# Patient Record
Sex: Female | Born: 1995 | Race: White | Hispanic: No | Marital: Single | Smoking: Current every day smoker
Health system: Southern US, Community
[De-identification: ages and names within clinical notes are randomized; demographics above are authoritative.]

## PROBLEM LIST (undated history)

## (undated) DIAGNOSIS — M069 Rheumatoid arthritis, unspecified: Secondary | ICD-10-CM

## (undated) DIAGNOSIS — G629 Polyneuropathy, unspecified: Secondary | ICD-10-CM

## (undated) DIAGNOSIS — T8859XA Other complications of anesthesia, initial encounter: Secondary | ICD-10-CM

## (undated) DIAGNOSIS — M797 Fibromyalgia: Secondary | ICD-10-CM

## (undated) HISTORY — PX: ADENOIDECTOMY: SUR15

## (undated) HISTORY — PX: MYRINGOTOMY: SUR874

## (undated) HISTORY — DX: Other complications of anesthesia, initial encounter: T88.59XA

---
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
## 2017-02-26 ENCOUNTER — Other Ambulatory Visit: Payer: Self-pay

## 2017-02-26 ENCOUNTER — Encounter (HOSPITAL_COMMUNITY): Payer: Self-pay

## 2017-02-26 ENCOUNTER — Emergency Department (HOSPITAL_COMMUNITY): Payer: Self-pay

## 2017-02-26 ENCOUNTER — Emergency Department (HOSPITAL_COMMUNITY)
Admission: EM | Admit: 2017-02-26 | Discharge: 2017-02-26 | Disposition: A | Discharge status: Home / Self Care | Payer: Self-pay | Attending: Emergency Medicine | Admitting: Emergency Medicine

## 2017-02-26 DIAGNOSIS — Y9389 Activity, other specified: Secondary | ICD-10-CM | POA: Insufficient documentation

## 2017-02-26 DIAGNOSIS — M25512 Pain in left shoulder: Secondary | ICD-10-CM | POA: Insufficient documentation

## 2017-02-26 DIAGNOSIS — Y999 Unspecified external cause status: Secondary | ICD-10-CM | POA: Insufficient documentation

## 2017-02-26 DIAGNOSIS — Z79899 Other long term (current) drug therapy: Secondary | ICD-10-CM | POA: Insufficient documentation

## 2017-02-26 DIAGNOSIS — F17210 Nicotine dependence, cigarettes, uncomplicated: Secondary | ICD-10-CM | POA: Insufficient documentation

## 2017-02-26 DIAGNOSIS — J45909 Unspecified asthma, uncomplicated: Secondary | ICD-10-CM | POA: Insufficient documentation

## 2017-02-26 DIAGNOSIS — S161XXA Strain of muscle, fascia and tendon at neck level, initial encounter: Secondary | ICD-10-CM | POA: Insufficient documentation

## 2017-02-26 DIAGNOSIS — Y92410 Unspecified street and highway as the place of occurrence of the external cause: Secondary | ICD-10-CM | POA: Insufficient documentation

## 2017-02-26 DIAGNOSIS — M25511 Pain in right shoulder: Secondary | ICD-10-CM | POA: Insufficient documentation

## 2017-02-26 HISTORY — DX: Rheumatoid arthritis, unspecified: M06.9

## 2017-02-26 HISTORY — DX: Fibromyalgia: M79.7

## 2017-02-26 HISTORY — DX: Polyneuropathy, unspecified: G62.9

## 2017-02-26 MED ORDER — ACETAMINOPHEN 500 MG PO TABS
1000.0000 mg | ORAL_TABLET | Freq: Once | ORAL | Status: AC
  Administered 2017-02-26: 1000 mg via ORAL
  Filled 2017-02-26: qty 2

## 2017-02-26 NOTE — Discharge Instructions (Signed)
Use ice, ibuprofen, Tylenol as needed for pain  Return for new or worsening symptoms.
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
## 2017-02-26 NOTE — ED Notes (Signed)
Patient transported to X-ray 

## 2017-02-26 NOTE — ED Notes (Signed)
ED Provider at bedside. 

## 2017-08-18 ENCOUNTER — Encounter (HOSPITAL_COMMUNITY): Payer: Self-pay | Admitting: Emergency Medicine

## 2017-08-18 ENCOUNTER — Other Ambulatory Visit: Payer: Self-pay

## 2017-08-18 ENCOUNTER — Emergency Department (HOSPITAL_COMMUNITY)
Admission: EM | Admit: 2017-08-18 | Discharge: 2017-08-19 | Disposition: A | Discharge status: Home / Self Care | Payer: Self-pay | Attending: Emergency Medicine | Admitting: Emergency Medicine

## 2017-08-18 DIAGNOSIS — Z79899 Other long term (current) drug therapy: Secondary | ICD-10-CM | POA: Insufficient documentation

## 2017-08-18 DIAGNOSIS — J020 Streptococcal pharyngitis: Secondary | ICD-10-CM | POA: Insufficient documentation

## 2017-08-18 DIAGNOSIS — J45909 Unspecified asthma, uncomplicated: Secondary | ICD-10-CM | POA: Insufficient documentation

## 2017-08-18 DIAGNOSIS — F17210 Nicotine dependence, cigarettes, uncomplicated: Secondary | ICD-10-CM | POA: Insufficient documentation

## 2017-08-18 LAB — GROUP A STREP BY PCR: GROUP A STREP BY PCR: DETECTED — AB

## 2017-08-18 NOTE — ED Triage Notes (Addendum)
Pt reports swelling to throat. Pt reports she is allergic to omnicef. Pt reports her boyfriend has been taking omnicef and she has been kissing him. Pt reports trying to take benadryl yesterday but she vomited. Pt reports vomiting since yesterday, unable to keep anything down.  Pt's throat is red, tongue is coated yellow-white.

## 2017-08-19 MED ORDER — ACETAMINOPHEN 325 MG PO TABS: 650.0000 mg | ORAL_TABLET | Freq: Four times a day (QID) | ORAL | 0 refills | Status: AC | PRN

## 2017-08-19 MED ORDER — AZITHROMYCIN 500 MG PO TABS: 500.0000 mg | ORAL_TABLET | Freq: Every day | ORAL | 0 refills | Status: AC

## 2017-08-19 NOTE — Discharge Instructions (Signed)
You were given a prescription for antibiotics. Please take the antibiotic prescription fully.   You may rotate Tylenol and ibuprofen to treat your fevers.  Make sure you are staying well-hydrated over the next several days.  Please follow up with your primary care provider within 5-7 days for re-evaluation of your symptoms. If you do not have a primary care provider, information for a healthcare clinic has been provided for you to make arrangements for follow up care. Please return to the emergency department for any new or worsening symptoms.

## 2017-08-19 NOTE — ED Provider Notes (Signed)
[facility] EMERGENCY DEPARTMENT Provider Note   CSN: [id number] Arrival date & time: 08/18/17  2134     History   Chief Complaint Chief Complaint  Patient presents with  . Sore Throat    HPI April Hart is a 22 y.o. female.  HPI   Patient is a 22 year old female presents the ED today to be evaluated for sore throat that has been ongoing for the last 3 days.  Patient reports chills and sweats but has had no documented fevers at home.  She is been able to tolerate p.o. at home with no difficulty.  She denies any cough or ear pain.  No chest pain shortness of breath.  She has had 2 episodes of vomiting yesterday, but denies any nausea or belly pain.  Continued vomiting today.  No diarrhea or constipation.  No urinary symptoms.  States that her boyfriend was recently diagnosed with strep throat.  Past Medical History:  Diagnosis Date  . Fibromyalgia   . Neuropathy   . Rheumatoid arthritis [facility])     Patient Active Problem List   Diagnosis Date Noted  . Asthma 01/02/2015  . Allergic rhinitis 01/02/2015    Past Surgical History:  Procedure Laterality Date  . ADENOIDECTOMY    . MYRINGOTOMY       OB History   None      Home Medications    Prior to Admission medications   Medication Sig Start Date End Date Taking? Authorizing Provider  acetaminophen (TYLENOL) 325 MG tablet Take 2 tablets (650 mg total) by mouth every 6 (six) hours as needed. Do not take more than 4000mg  of tylenol per day 08/19/17   Heiress Williamson S, PA-C  albuterol (PROVENTIL HFA;VENTOLIN HFA) 108 (90 BASE) MCG/ACT inhaler Inhale 2 puffs into the lungs every 6 (six) hours as needed for wheezing or shortness of breath.    [provider]  azithromycin (ZITHROMAX) 500 MG tablet Take 1 tablet (500 mg total) by mouth daily for 5 days. 08/19/17 08/24/17  Merrie Epler S, PA-C  loratadine (CLARITIN) 10 MG tablet Take 10 mg by mouth daily as needed for allergies.    [provider]  mometasone (NASONEX) 50 MCG/ACT nasal spray Place 2 sprays into the nose 3 (three) times a week.    [provider]  montelukast (SINGULAIR) 10 MG tablet Take 10 mg by mouth daily.    [provider]    Family History History reviewed. No pertinent family history.  Social History Social History   Tobacco Use  . Smoking status: Current Every Day Smoker    Packs/day: 1.00    Types: Cigarettes  Substance Use Topics  . Alcohol use: No    Frequency: Never  . Drug use: No     Allergies   Amoxicillin; Omnicef [cefdinir]; and Penicillins   Review of Systems Review of Systems  Constitutional: Positive for chills and diaphoresis.  HENT: Positive for sore throat. Negative for congestion and rhinorrhea.   Eyes: Negative for visual disturbance.  Respiratory: Negative for cough and shortness of breath.   Cardiovascular: Negative for chest pain.  Gastrointestinal: Positive for vomiting. Negative for abdominal pain, constipation, diarrhea and nausea.  Genitourinary: Negative for pelvic pain.  Musculoskeletal: Negative for back pain.  Skin: Negative for rash.  Neurological: Negative for headaches.     Physical Exam Updated Vital Signs BP 113/82   Pulse 84   Temp 98.5 F (36.9 C) (Oral)   Resp 20   Ht  5\' 2"  (1.575 m)   Wt 49.9 kg (110 lb)   LMP 08/03/2017   SpO2 99%   BMI 20.12 kg/m   Physical Exam  Constitutional: She appears well-developed and well-nourished. No distress.  HENT:  Head: Normocephalic and atraumatic.  Right Ear: Hearing, tympanic membrane and ear canal normal.  Left Ear: Hearing, tympanic membrane and ear canal normal.  Mouth/Throat: Mucous membranes are normal.  Pharyngeal erythema and exudates are noted.  Patient does not have tonsils.  Uvula is midline.  She is tolerating secretions with a normal voice.  Eyes: Pupils are equal, round, and reactive to light. Conjunctivae and EOM are normal.  Neck: Normal range of  motion. Neck supple.  Cardiovascular: Normal rate, regular rhythm and normal heart sounds.  No murmur heard. Pulmonary/Chest: Effort normal and breath sounds normal. No respiratory distress. She has no wheezes.  Abdominal: Soft. Bowel sounds are normal. There is no tenderness.  Musculoskeletal: She exhibits no edema.  Lymphadenopathy:    She has no cervical adenopathy.  Neurological: She is alert.  Skin: Skin is warm and dry.  Psychiatric: She has a normal mood and affect.  Nursing note and vitals reviewed.    ED Treatments / Results  Labs (all labs ordered are listed, but only abnormal results are displayed) Labs Reviewed  GROUP A STREP BY PCR - Abnormal; Notable for the following components:      Result Value   Group A Strep by PCR DETECTED (*)    All other components within normal limits    EKG None  Radiology No results found.  Procedures Procedures (including critical care time)  Medications Ordered in ED Medications - No data to display   Initial Impression / Assessment and Plan / ED Course  I have reviewed the triage vital signs and the nursing notes.  Pertinent labs & imaging results that were available during my care of the patient were reviewed by me and considered in my medical decision making (see chart for details).   Final Clinical Impressions(s) / ED Diagnoses   Final diagnoses:  Strep throat   Pt febrile with tonsillar exudate, cervical lymphadenopathy, & dysphagia; diagnosis of strep.  Patient is allergic to penicillin.  Will give prescription for azithromycin.  Pt appears mildly dehydrated, discussed importance of water rehydration. Presentation non concerning for PTA or infxn spread to soft tissue. No trismus or uvula deviation. Specific return precautions discussed. Pt able to drink water in ED without difficulty with intact air way. Recommended PCP follow up.   ED Discharge Orders        Ordered    azithromycin (ZITHROMAX) 500 MG tablet  Daily      08/19/17 0047    acetaminophen (TYLENOL) 325 MG tablet  Every 6 hours PRN     08/19/17 0047       10/19/17, PA-C 08/19/17 0053    10/19/17, MD 08/19/17 1454

## 2018-01-23 ENCOUNTER — Encounter (HOSPITAL_COMMUNITY): Payer: Self-pay | Admitting: Emergency Medicine

## 2018-01-23 ENCOUNTER — Ambulatory Visit (HOSPITAL_COMMUNITY)
Admission: EM | Admit: 2018-01-23 | Discharge: 2018-01-23 | Disposition: A | Discharge status: Home / Self Care | Payer: Medicaid Other | Attending: Family Medicine | Admitting: Family Medicine

## 2018-01-23 DIAGNOSIS — Z880 Allergy status to penicillin: Secondary | ICD-10-CM | POA: Insufficient documentation

## 2018-01-23 DIAGNOSIS — F17210 Nicotine dependence, cigarettes, uncomplicated: Secondary | ICD-10-CM | POA: Diagnosis not present

## 2018-01-23 DIAGNOSIS — M069 Rheumatoid arthritis, unspecified: Secondary | ICD-10-CM | POA: Insufficient documentation

## 2018-01-23 DIAGNOSIS — Z881 Allergy status to other antibiotic agents status: Secondary | ICD-10-CM | POA: Insufficient documentation

## 2018-01-23 DIAGNOSIS — M797 Fibromyalgia: Secondary | ICD-10-CM | POA: Diagnosis not present

## 2018-01-23 DIAGNOSIS — J020 Streptococcal pharyngitis: Secondary | ICD-10-CM

## 2018-01-23 DIAGNOSIS — G629 Polyneuropathy, unspecified: Secondary | ICD-10-CM | POA: Insufficient documentation

## 2018-01-23 DIAGNOSIS — Z79899 Other long term (current) drug therapy: Secondary | ICD-10-CM | POA: Diagnosis not present

## 2018-01-23 DIAGNOSIS — J029 Acute pharyngitis, unspecified: Secondary | ICD-10-CM | POA: Diagnosis present

## 2018-01-23 LAB — POCT RAPID STREP A: Streptococcus, Group A Screen (Direct): NEGATIVE

## 2018-01-23 MED ORDER — ALBUTEROL SULFATE HFA 108 (90 BASE) MCG/ACT IN AERS: 2.0000 | INHALATION_SPRAY | Freq: Four times a day (QID) | RESPIRATORY_TRACT | 11 refills | Status: AC | PRN

## 2018-01-23 MED ORDER — CLINDAMYCIN HCL 150 MG PO CAPS: 150.0000 mg | ORAL_CAPSULE | Freq: Three times a day (TID) | ORAL | 0 refills | Status: AC

## 2018-01-23 MED ORDER — MONTELUKAST SODIUM 10 MG PO TABS: 10.0000 mg | ORAL_TABLET | Freq: Every day | ORAL | 3 refills | Status: AC

## 2018-01-23 MED ORDER — CLINDAMYCIN HCL 150 MG PO CAPS: 150.0000 mg | ORAL_CAPSULE | Freq: Three times a day (TID) | ORAL | 0 refills | Status: DC

## 2018-01-23 MED ORDER — CHLORHEXIDINE GLUCONATE 0.12 % MT SOLN: 15.0000 mL | Freq: Two times a day (BID) | OROMUCOSAL | 0 refills | Status: AC

## 2018-01-23 MED ORDER — MOMETASONE FUROATE 50 MCG/ACT NA SUSP: 2.0000 | NASAL | 3 refills | Status: AC

## 2018-01-23 MED ORDER — CHLORHEXIDINE GLUCONATE 0.12 % MT SOLN: 15.0000 mL | Freq: Two times a day (BID) | OROMUCOSAL | 0 refills | Status: DC

## 2018-01-23 MED ORDER — LORATADINE 10 MG PO TABS: 10.0000 mg | ORAL_TABLET | Freq: Every day | ORAL | 1 refills | Status: AC | PRN

## 2018-01-23 NOTE — Discharge Instructions (Addendum)
Use Afrin (oxymetazoline) nasal spray at night to open up your sinuses and allow you to sleep.  Discontinue the Chapstick that you are using.

## 2018-01-23 NOTE — ED Triage Notes (Signed)
Pt states her boyfriend has strep and her throat is also sore.

## 2018-01-23 NOTE — ED Provider Notes (Signed)
[facility]    CSN: [id number] Arrival date & time: 01/23/18  1618     History   Chief Complaint Chief Complaint  Patient presents with  . Sore Throat    HPI April Hart is a 22 y.o. female.   Pt states her boyfriend has strep and her throat is also sore.  She complains about ear pain as well on the left.  She is no longer working.  Patient states that her symptoms began about a week and 1/2 to 2 weeks ago.     Past Medical History:  Diagnosis Date  . Fibromyalgia   . Neuropathy   . Rheumatoid arthritis [facility])     Patient Active Problem List   Diagnosis Date Noted  . Asthma 01/02/2015  . Allergic rhinitis 01/02/2015    Past Surgical History:  Procedure Laterality Date  . ADENOIDECTOMY    . MYRINGOTOMY      OB History   None      Home Medications    Prior to Admission medications   Medication Sig Start Date End Date Taking? Authorizing Provider  acetaminophen (TYLENOL) 325 MG tablet Take 2 tablets (650 mg total) by mouth every 6 (six) hours as needed. Do not take more than 4000mg  of tylenol per day 08/19/17   Couture, Cortni S, PA-C  albuterol (PROVENTIL HFA;VENTOLIN HFA) 108 (90 BASE) MCG/ACT inhaler Inhale 2 puffs into the lungs every 6 (six) hours as needed for wheezing or shortness of breath.    [provider]  chlorhexidine (PERIDEX) 0.12 % solution Use as directed 15 mLs in the mouth or throat 2 (two) times daily. 01/23/18   01/25/18, MD  clindamycin (CLEOCIN) 150 MG capsule Take 1 capsule (150 mg total) by mouth 3 (three) times daily. 01/23/18   01/25/18, MD  loratadine (CLARITIN) 10 MG tablet Take 10 mg by mouth daily as needed for allergies.    [provider]  mometasone (NASONEX) 50 MCG/ACT nasal spray Place 2 sprays into the nose 3 (three) times a week.    [provider]  montelukast (SINGULAIR) 10 MG tablet Take 10 mg by mouth daily.    [provider]    Family History No  family history on file.  Social History Social History   Tobacco Use  . Smoking status: Current Every Day Smoker    Packs/day: 1.00    Types: Cigarettes  Substance Use Topics  . Alcohol use: No    Frequency: Never  . Drug use: No     Allergies   Amoxicillin; Omnicef [cefdinir]; and Penicillins   Review of Systems Review of Systems  Constitutional: Positive for chills and fatigue.  HENT: Positive for congestion, mouth sores and sore throat.   Respiratory: Negative.   Cardiovascular: Negative.   Gastrointestinal: Negative.   Musculoskeletal: Positive for myalgias.  Neurological: Positive for headaches.  All other systems reviewed and are negative.    Physical Exam Triage Vital Signs ED Triage Vitals [01/23/18 1647]  Enc Vitals Group     BP 114/82     Pulse Rate 70     Resp 18     Temp 98.4 F (36.9 C)     Temp src      SpO2 98 %     Weight      Height      Head Circumference      Peak Flow      Pain Score 8     Pain Loc  Pain Edu?      Excl. in GC?    No data found.  Updated Vital Signs BP 114/82   Pulse 70   Temp 98.4 F (36.9 C)   Resp 18   SpO2 98%    Physical Exam  Constitutional: She appears well-developed and well-nourished.  HENT:  Head: Atraumatic.  Right Ear: Hearing, tympanic membrane and ear canal normal.  Left Ear: Hearing, tympanic membrane and ear canal normal.  Mouth/Throat: Posterior oropharyngeal erythema present. No tonsillar exudate.  Lower lip is mildly swollen and edematous  Eyes: Pupils are equal, round, and reactive to light. EOM are normal.  Neck: Normal range of motion. Neck supple.  Cardiovascular: Normal rate.  Pulmonary/Chest: Effort normal.  Neurological: She is alert.  Skin: Skin is warm and dry.  Psychiatric: She has a normal mood and affect.  Nursing note and vitals reviewed.    UC Treatments / Results  Labs (all labs ordered are listed, but only abnormal results are displayed) Labs Reviewed    CULTURE, GROUP A STREP [facility])    EKG None  Radiology No results found.  Procedures Procedures (including critical care time)  Medications Ordered in UC Medications - No data to display  Initial Impression / Assessment and Plan / UC Course  I have reviewed the triage vital signs and the nursing notes.  Pertinent labs & imaging results that were available during my care of the patient were reviewed by me and considered in my medical decision making (see chart for details).     Final Clinical Impressions(s) / UC Diagnoses   Final diagnoses:  Strep pharyngitis     Discharge Instructions     Use Afrin (oxymetazoline) nasal spray at night to open up your sinuses and allow you to sleep.  Discontinue the Chapstick that you are using.    ED Prescriptions    Medication Sig Dispense Auth. Provider   clindamycin (CLEOCIN) 150 MG capsule Take 1 capsule (150 mg total) by mouth 3 (three) times daily. 28 capsule Elvina Sidle, MD   chlorhexidine (PERIDEX) 0.12 % solution Use as directed 15 mLs in the mouth or throat 2 (two) times daily. 120 mL Elvina Sidle, MD     Controlled Substance Prescriptions [place] Controlled Substance Registry consulted? Not Applicable   Elvina Sidle, MD 01/23/18 1700

## 2018-01-25 LAB — CULTURE, GROUP A STREP (THRC)

## 2018-01-26 ENCOUNTER — Telehealth (HOSPITAL_COMMUNITY): Payer: Self-pay

## 2018-01-26 NOTE — Telephone Encounter (Signed)
Culture is positive for group A Strep germ.  Prescription for penicillin V 500mg  bid x 10d #20 given to patient at visit. Attempted to contact patient. No answer at this time.

## 2020-07-14 ENCOUNTER — Inpatient Hospital Stay (HOSPITAL_COMMUNITY): Payer: Medicaid Other

## 2020-07-14 ENCOUNTER — Inpatient Hospital Stay (HOSPITAL_COMMUNITY)
Admission: AD | Admit: 2020-07-14 | Discharge: 2020-07-14 | Disposition: A | Discharge status: Home / Self Care | Payer: Medicaid Other | Attending: Obstetrics and Gynecology | Admitting: Obstetrics and Gynecology

## 2020-07-14 ENCOUNTER — Other Ambulatory Visit: Payer: Self-pay

## 2020-07-14 ENCOUNTER — Encounter (HOSPITAL_COMMUNITY): Payer: Self-pay | Admitting: Obstetrics and Gynecology

## 2020-07-14 DIAGNOSIS — O99331 Smoking (tobacco) complicating pregnancy, first trimester: Secondary | ICD-10-CM | POA: Diagnosis not present

## 2020-07-14 DIAGNOSIS — T7491XA Unspecified adult maltreatment, confirmed, initial encounter: Secondary | ICD-10-CM

## 2020-07-14 DIAGNOSIS — Z3A08 8 weeks gestation of pregnancy: Secondary | ICD-10-CM | POA: Diagnosis not present

## 2020-07-14 DIAGNOSIS — F17210 Nicotine dependence, cigarettes, uncomplicated: Secondary | ICD-10-CM | POA: Diagnosis not present

## 2020-07-14 DIAGNOSIS — O9A311 Physical abuse complicating pregnancy, first trimester: Secondary | ICD-10-CM | POA: Insufficient documentation

## 2020-07-14 DIAGNOSIS — O26891 Other specified pregnancy related conditions, first trimester: Secondary | ICD-10-CM | POA: Insufficient documentation

## 2020-07-14 DIAGNOSIS — R109 Unspecified abdominal pain: Secondary | ICD-10-CM | POA: Insufficient documentation

## 2020-07-14 DIAGNOSIS — T7411XA Adult physical abuse, confirmed, initial encounter: Secondary | ICD-10-CM

## 2020-07-14 DIAGNOSIS — O30041 Twin pregnancy, dichorionic/diamniotic, first trimester: Secondary | ICD-10-CM | POA: Insufficient documentation

## 2020-07-14 LAB — URINALYSIS, ROUTINE W REFLEX MICROSCOPIC
Bilirubin Urine: NEGATIVE
Glucose, UA: NEGATIVE mg/dL
Hgb urine dipstick: NEGATIVE
Ketones, ur: NEGATIVE mg/dL
Leukocytes,Ua: NEGATIVE
Nitrite: NEGATIVE
Protein, ur: NEGATIVE mg/dL
Specific Gravity, Urine: 1.015 (ref 1.005–1.030)
pH: 5 (ref 5.0–8.0)

## 2020-07-14 LAB — POCT PREGNANCY, URINE: Preg Test, Ur: POSITIVE — AB
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
## 2020-07-14 NOTE — Discharge Instructions (Signed)
JAMA, [id number]. [email or web address]  Domestic Violence and Pregnancy Intimate partner violence is any type of physical, sexual, or emotional harm done by a current or former partner. Emotional abuse includes threatening and controlling behavior. Intimate partner violence is also called domestic violence. Intimate partner violence is especially dangerous during pregnancy because the abuse may affect you and your developing baby. Call a domestic violence hotline or let your health care provider know if you are being physically or sexually abused, or if your partner's threatening or controlling behavior is making you feel unsafe. Getting help and support protects you, your pregnancy, and your baby. How does this affect me? Intimate partner violence can cause:  Physical effects, such as: ? Injury or death (homicide). ? Digestion problems. ? Loss of appetite. ? Frequent infections, including sexually transmitted infections. ? High blood pressure.  Emotional effects, such as: ? Fear and worry. ? Trouble sleeping. ? Depression. ? Anxiety. ? Thoughts of hurting yourself or committing suicide. Intimate partner violence may affect your pregnancy in these ways:  You are more likely to be injured or have poor health.  You may be less likely to get prenatal care.  You may not gain a healthy amount of weight or have good nutrition.  You may be more likely to smoke, use drugs, and drink alcohol to relieve stress.  You may be at higher risk of losing your pregnancy (miscarriage or stillbirth).  Your baby may be born before 37 weeks of pregnancy (premature). How does this affect my baby? If you experience intimate partner violence during pregnancy:  Your baby may be injured.  Your baby may not survive the pregnancy (miscarriage or stillbirth).  Your baby may be born premature, which can cause mental and physical problems.  Your baby may not grow well in your  womb and may be born small (small for gestational age).  If you use alcohol, your baby may be born with fetal alcohol syndrome. This can cause birth defects and other problems.  You may have trouble bonding with your baby. This can lead to neglect.  You may be less likely to breastfeed. This is the healthiest way to feed your baby. Follow these instructions at home:  Let your health care provider know that you are experiencing intimate partner violence.  Do not bring an abusive partner with you to your prenatal visits. This will allow you to speak freely with your health care provider.  Learn about and use resources on intimate partner violence, such as the [employer] Violence Hotline: thehotline.org  Consider getting counseling.  Consider getting a legal document that says your partner has to stay away from you (restraining order).  Have a support person stay with you in your home. Have an escape plan to get to a safe place.  Do not smoke,use drugs, or drink alcohol to relieve stress.  Keep all your prenatal visits as told. This is important.   Where to find more information  The [employer] Violence Hotline: ? 24-hour hotline: 403 562 4143 or 385-480-7576 Gwenevere Abbot) ? Website: thehotline.org  The National Sexual Assault Hotline: ? 24-hour hotline: 440-023-8007 ? Website: rainn.org  Love is respect: ? Text "loveis" to (708) 420-3462 ? Call: 236 365 3735 or (279) 233-2889 Gwenevere Abbot) ? Website: loveisrespect.org If you do not feel safe searching for help online at home, use a computer at a [employer] to access the internet. Call 911 if you are in immediate danger or need medical help. Contact a health care provider if:  You experience any type  of intimate partner violence at home.  You need help to quit smoking, drinking, or taking drugs. Get help right away if:  You do not feel safe at home.  You have thoughts of hurting yourself or committing suicide. If you ever feel  like you may hurt yourself or others, or have thoughts about taking your own life, get help right away. Go to your nearest emergency department or:  Call your local emergency services (911 in the [place]  Call a suicide crisis helpline, such as the National Suicide Prevention Lifeline at 760-819-3953. This is open 24 hours a day in the [place]  Text the Crisis Text Line at 639-762-1094 (in the [place] Summary  Intimate partner violence is also called domestic violence. This kind of violence can be physical, sexual, or emotional.  Intimate partner violence is especially dangerous during pregnancy. It can increase your baby's risk of miscarriage, stillbirth, and premature birth.  Tell your health care provider if you experience any type of intimate partner violence. Get help right away if you do not feel safe at home. This information is not intended to replace advice given to you by your health care provider. Make sure you discuss any questions you have with your health care provider. Document Revised: 08/04/2019 Document Reviewed: 08/04/2019 Elsevier Patient Education  2021 [employer].      [facility] ([place]).Twin and Triplet Pregnancy. [place]: [employer] and [employer] ([place]); 2019.">  Multiple Pregnancy Multiple pregnancy means that a woman is carrying more than one baby at a time. She may be pregnant with twins, triplets, or more. The majority of multiple pregnancies are twins. Naturally conceiving triplets or more (higher-order multiples) is rare. Multiple pregnancies are riskier than single pregnancies. A woman with a multiple pregnancy is more likely to have certain problems during her pregnancy. How does a multiple pregnancy happen? A multiple pregnancy happens when:  The woman's body releases more than one egg at a time, and then each egg gets fertilized by a different sperm. ? This is the most common type of multiple pregnancy. ? Twins or other  multiples produced this way are called fraternal. They are no more alike than non-multiple siblings are.  One sperm fertilizes one egg, which then divides into more than one embryo. ? Twins or other multiples produced this way are called identical. Identical multiples are always the same gender, and they look very much alike.   Who is most likely to have a multiple pregnancy? A multiple pregnancy is more likely to develop in women who:  Have had fertility treatment, especially if the treatment included fertility medicines.  Are older than 23 years of age.  Have already had four or more children.  Have a family history of multiple pregnancy. How is a multiple pregnancy diagnosed? A multiple pregnancy may be diagnosed based on:  Symptoms such as: ? Rapid weight gain in the first 3 months of pregnancy (first trimester). ? More severe nausea and breast tenderness than what is typical of a single pregnancy. ? A larger uterus than what is normal for the stage of the pregnancy.  Blood tests that detect a higher-than-normal level of human chorionic gonadotropin (hCG). This is a hormone that your body produces in early pregnancy.  An ultrasound exam. This is used to confirm that you are carrying multiples. What risks come with multiple pregnancy? A multiple pregnancy puts you at a higher risk for certain problems during or after your pregnancy. These include:  Delivering  your babies before your due date (preterm birth). A full-term pregnancy lasts for at least 37 weeks. ? Babies born before 37 weeks may have a higher risk for breathing problems, feeding difficulties, cerebral palsy, and learning disabilities.  Diabetes.  Preeclampsia. This is a serious condition that causes high blood pressure and headaches during pregnancy.  Too much blood loss after childbirth (postpartum hemorrhage).  Postpartum depression.  Low birth weight of the babies. How will having a multiple pregnancy affect  my care? Your health care team will monitor you more closely. You may need more frequent prenatal visits. This will ensure that you are healthy and that your babies are growing normally. Follow these instructions at home: Eating and drinking  Increase your nutrition. ? Follow your health care provider's recommendations for weight gain. You may need to gain a little extra weight when you are pregnant with multiples. ? Eat healthy snacks often throughout the day. This will add calories and reduce nausea.  Drink enough fluid to keep your urine pale yellow.  Take prenatal vitamins. Ask your health care provider what vitamins are right for you. Activity Limit your activities by 20-24 weeks of pregnancy.  Rest often.  Avoid activities, exercise, and work that take a lot of effort.  Ask your health care provider when you should stop having sex. General instructions  Do not use any products that contain nicotine or tobacco, such as cigarettes, e-cigarettes, and chewing tobacco. If you need help quitting, ask your health care provider.  Do not drink alcohol or use illegal drugs.  Take over-the-counter and prescription medicines only as told by your health care provider.  Arrange for extra help around the house.  Keep all follow-up visits and all prenatal visits as told by your health care provider. This is important. Where to find more information  [employer]: www.acog.org Contact a health care provider if:  You have dizziness.  You have nausea, vomiting, or diarrhea that does not go away.  You have depression or other emotions that are interfering with your normal activities.  You have a fever.  You have pain with urination.  You have a bad-smelling vaginal discharge.  You notice increased swelling in your face, hands, legs, or ankles. Get help right away if:  You have fluid leaking from your vagina.  You have bleeding from your  vagina.  You have pelvic cramps, pelvic pressure, or nagging pain in your abdomen or lower back.  You are having regular contractions.  You have a severe headache, with or without changes in how you see.  You have chest pain or shortness of breath.  You notice that your babies move less often, or do not move at all. Summary  Having a multiple pregnancy means that a woman is carrying more than one baby at a time.  A multiple pregnancy puts you at a higher risk for delivering your babies before your due date, having diabetes, preeclampsia, too much blood loss after childbirth, or low birth weight of the babies.  Your health care provider will monitor you more closely during your pregnancy.  You may need to make some lifestyle changes during pregnancy. This includes eating more, limiting your activities after 20-24 weeks of pregnancy, and arranging for extra help around the house.  Follow up with your health care provider as instructed if you experience any complications. This information is not intended to replace advice given to you by your health care provider. Make sure you discuss  any questions you have with your health care provider. Document Revised: 11/21/2018 Document Reviewed: 11/21/2018 Elsevier Patient Education  2021 [employer].
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
## 2020-09-13 ENCOUNTER — Other Ambulatory Visit: Payer: Self-pay | Admitting: Obstetrics & Gynecology

## 2020-09-13 DIAGNOSIS — O30042 Twin pregnancy, dichorionic/diamniotic, second trimester: Secondary | ICD-10-CM

## 2020-09-13 DIAGNOSIS — Z3A17 17 weeks gestation of pregnancy: Secondary | ICD-10-CM

## 2020-09-24 ENCOUNTER — Other Ambulatory Visit: Payer: Self-pay

## 2020-10-08 ENCOUNTER — Ambulatory Visit: Payer: Medicaid Other | Attending: Obstetrics and Gynecology

## 2020-10-08 ENCOUNTER — Ambulatory Visit: Payer: Medicaid Other | Admitting: *Deleted

## 2020-10-08 ENCOUNTER — Other Ambulatory Visit: Payer: Self-pay | Admitting: *Deleted

## 2020-10-08 ENCOUNTER — Other Ambulatory Visit: Payer: Self-pay

## 2020-10-08 ENCOUNTER — Encounter: Payer: Self-pay | Admitting: *Deleted

## 2020-10-08 ENCOUNTER — Ambulatory Visit: Payer: Medicaid Other | Attending: Obstetrics | Admitting: Obstetrics

## 2020-10-08 VITALS — BP 104/67 | HR 71

## 2020-10-08 DIAGNOSIS — Z3A20 20 weeks gestation of pregnancy: Secondary | ICD-10-CM

## 2020-10-08 DIAGNOSIS — O30042 Twin pregnancy, dichorionic/diamniotic, second trimester: Secondary | ICD-10-CM | POA: Diagnosis not present

## 2020-10-08 DIAGNOSIS — Z3A17 17 weeks gestation of pregnancy: Secondary | ICD-10-CM | POA: Diagnosis present
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
## 2020-11-05 ENCOUNTER — Other Ambulatory Visit: Payer: Self-pay

## 2020-11-05 ENCOUNTER — Encounter: Payer: Self-pay | Admitting: *Deleted

## 2020-11-05 ENCOUNTER — Ambulatory Visit: Payer: Medicaid Other | Attending: Obstetrics

## 2020-11-05 ENCOUNTER — Ambulatory Visit: Payer: Medicaid Other | Admitting: *Deleted

## 2020-11-05 ENCOUNTER — Other Ambulatory Visit: Payer: Self-pay | Admitting: *Deleted

## 2020-11-05 VITALS — BP 109/63 | HR 74

## 2020-11-05 DIAGNOSIS — Z3A24 24 weeks gestation of pregnancy: Secondary | ICD-10-CM

## 2020-11-05 DIAGNOSIS — O99332 Smoking (tobacco) complicating pregnancy, second trimester: Secondary | ICD-10-CM | POA: Diagnosis not present

## 2020-11-05 DIAGNOSIS — Z363 Encounter for antenatal screening for malformations: Secondary | ICD-10-CM

## 2020-11-05 DIAGNOSIS — O30042 Twin pregnancy, dichorionic/diamniotic, second trimester: Secondary | ICD-10-CM

## 2020-11-05 DIAGNOSIS — F17210 Nicotine dependence, cigarettes, uncomplicated: Secondary | ICD-10-CM

## 2020-11-05 DIAGNOSIS — O321XX1 Maternal care for breech presentation, fetus 1: Secondary | ICD-10-CM

## 2020-11-05 DIAGNOSIS — O30002 Twin pregnancy, unspecified number of placenta and unspecified number of amniotic sacs, second trimester: Secondary | ICD-10-CM

## 2020-11-05 DIAGNOSIS — O322XX2 Maternal care for transverse and oblique lie, fetus 2: Secondary | ICD-10-CM

## 2020-12-03 ENCOUNTER — Ambulatory Visit: Payer: Medicaid Other | Admitting: *Deleted

## 2020-12-03 ENCOUNTER — Encounter: Payer: Self-pay | Admitting: *Deleted

## 2020-12-03 ENCOUNTER — Other Ambulatory Visit: Payer: Self-pay | Admitting: Maternal & Fetal Medicine

## 2020-12-03 ENCOUNTER — Ambulatory Visit: Payer: Medicaid Other | Attending: Obstetrics and Gynecology

## 2020-12-03 ENCOUNTER — Other Ambulatory Visit: Payer: Self-pay

## 2020-12-03 VITALS — BP 102/71 | HR 81

## 2020-12-03 DIAGNOSIS — O99333 Smoking (tobacco) complicating pregnancy, third trimester: Secondary | ICD-10-CM

## 2020-12-03 DIAGNOSIS — O30043 Twin pregnancy, dichorionic/diamniotic, third trimester: Secondary | ICD-10-CM | POA: Insufficient documentation

## 2020-12-03 DIAGNOSIS — Z3A28 28 weeks gestation of pregnancy: Secondary | ICD-10-CM

## 2020-12-03 DIAGNOSIS — F17210 Nicotine dependence, cigarettes, uncomplicated: Secondary | ICD-10-CM

## 2020-12-03 DIAGNOSIS — O321XX1 Maternal care for breech presentation, fetus 1: Secondary | ICD-10-CM | POA: Diagnosis not present

## 2020-12-03 DIAGNOSIS — O30002 Twin pregnancy, unspecified number of placenta and unspecified number of amniotic sacs, second trimester: Secondary | ICD-10-CM | POA: Diagnosis not present

## 2020-12-03 DIAGNOSIS — O321XX2 Maternal care for breech presentation, fetus 2: Secondary | ICD-10-CM

## 2020-12-25 ENCOUNTER — Ambulatory Visit: Payer: Medicaid Other

## 2020-12-30 ENCOUNTER — Ambulatory Visit: Payer: Medicaid Other

## 2020-12-31 ENCOUNTER — Ambulatory Visit: Payer: Medicaid Other | Admitting: *Deleted

## 2020-12-31 ENCOUNTER — Other Ambulatory Visit: Payer: Self-pay

## 2020-12-31 ENCOUNTER — Encounter: Payer: Self-pay | Admitting: *Deleted

## 2020-12-31 ENCOUNTER — Other Ambulatory Visit: Payer: Self-pay | Admitting: *Deleted

## 2020-12-31 ENCOUNTER — Ambulatory Visit: Payer: Medicaid Other | Attending: Maternal & Fetal Medicine

## 2020-12-31 VITALS — BP 117/82 | HR 99

## 2020-12-31 DIAGNOSIS — O99333 Smoking (tobacco) complicating pregnancy, third trimester: Secondary | ICD-10-CM | POA: Insufficient documentation

## 2020-12-31 DIAGNOSIS — O30043 Twin pregnancy, dichorionic/diamniotic, third trimester: Secondary | ICD-10-CM

## 2020-12-31 DIAGNOSIS — Z3A32 32 weeks gestation of pregnancy: Secondary | ICD-10-CM | POA: Diagnosis not present

## 2021-01-08 ENCOUNTER — Other Ambulatory Visit: Payer: Self-pay

## 2021-01-08 ENCOUNTER — Encounter: Payer: Self-pay | Admitting: Registered"

## 2021-01-08 ENCOUNTER — Encounter: Payer: Medicaid Other | Attending: Obstetrics and Gynecology | Admitting: Registered"

## 2021-01-08 DIAGNOSIS — Z3A00 Weeks of gestation of pregnancy not specified: Secondary | ICD-10-CM | POA: Diagnosis not present

## 2021-01-08 DIAGNOSIS — O24419 Gestational diabetes mellitus in pregnancy, unspecified control: Secondary | ICD-10-CM | POA: Insufficient documentation

## 2021-01-08 DIAGNOSIS — R7309 Other abnormal glucose: Secondary | ICD-10-CM | POA: Diagnosis not present

## 2021-01-08 NOTE — Progress Notes (Signed)
Patient was seen on 01/08/21 for Gestational Diabetes self-management class at the [facility]. The following learning objectives were met by the patient during this course:  States the definition of Gestational Diabetes States why dietary management is important in controlling blood glucose Describes the effects each nutrient has on blood glucose levels Demonstrates ability to create a balanced meal plan Demonstrates carbohydrate counting  States when to check blood glucose levels Demonstrates proper blood glucose monitoring techniques States the effect of stress and exercise on blood glucose levels States the importance of limiting caffeine and abstaining from alcohol and smoking  Blood glucose monitor given: [employer] Reflect Lot# [id number] X Exp: 02/10/2021 Blood Glucose: 99 mg/mL  Patient instructed to monitor glucose levels: FBS: 60 - <95; 1 hour: <140; 2 hour: <120  Patient received handouts: Nutrition Diabetes and Pregnancy, including carb counting list  Patient will be seen for follow-up as needed.

## 2021-01-14 ENCOUNTER — Ambulatory Visit: Payer: Medicaid Other

## 2021-01-15 ENCOUNTER — Ambulatory Visit: Payer: Medicaid Other

## 2021-01-15 ENCOUNTER — Other Ambulatory Visit: Payer: Medicaid Other

## 2021-01-21 ENCOUNTER — Ambulatory Visit: Payer: Medicaid Other

## 2021-01-28 ENCOUNTER — Ambulatory Visit: Payer: Medicaid Other

## 2021-02-11 ENCOUNTER — Encounter (HOSPITAL_COMMUNITY): Payer: Self-pay

## 2021-02-11 ENCOUNTER — Inpatient Hospital Stay (HOSPITAL_COMMUNITY): Admit: 2021-02-11 | Payer: Medicaid Other | Admitting: Obstetrics and Gynecology

## 2021-02-11 SURGERY — Surgical Case
Anesthesia: Regional

## 2021-05-22 IMAGING — US US OB COMP LESS 14 WK
1 series · 15 of 28 positions shown · non-contrast
Comparison: None.

CLINICAL DATA: Abdominal pain. Estimated gestational age by last
menstrual period equals 8 weeks 3 days.

EXAM:
TWIN OBSTETRIC <14WK US AND TRANSVAGINAL OB US

[Series 1: us ob comp less 14 wk · 51 acquisitions, 15 frames shown]
[im 1/51]
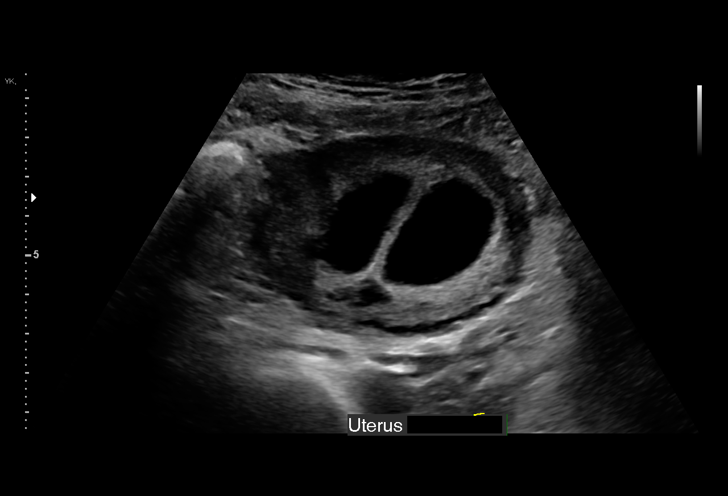
[im 4/51]
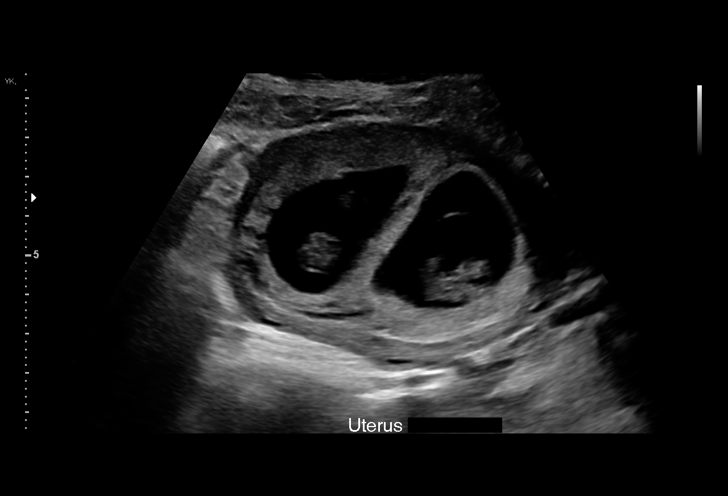
[im 8/51]
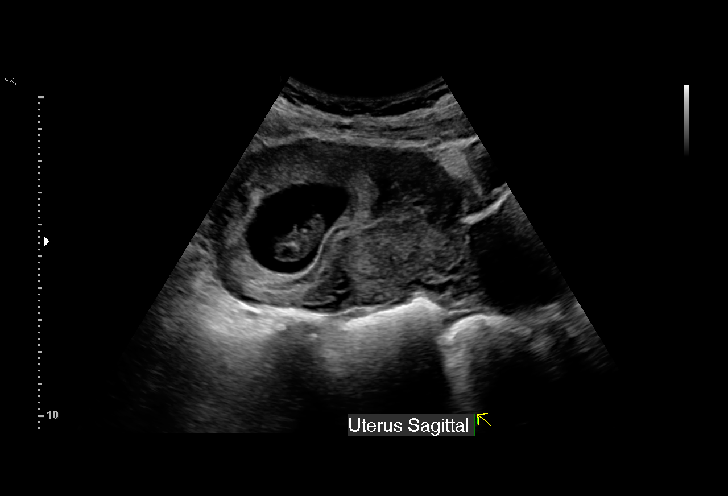
[im 12/51]
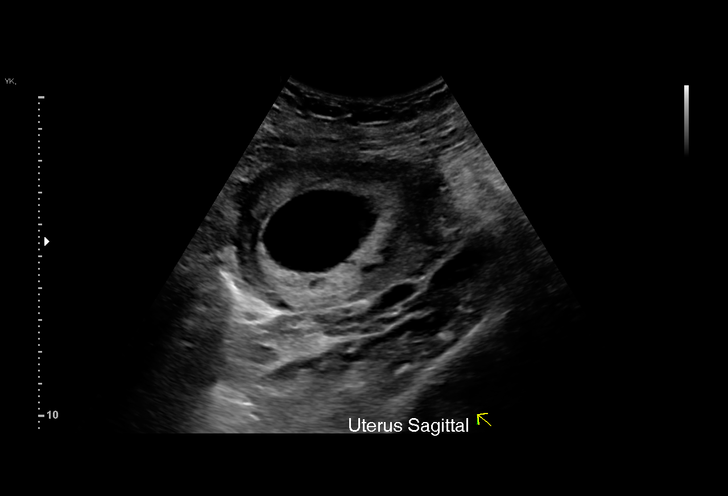
[im 15/51]
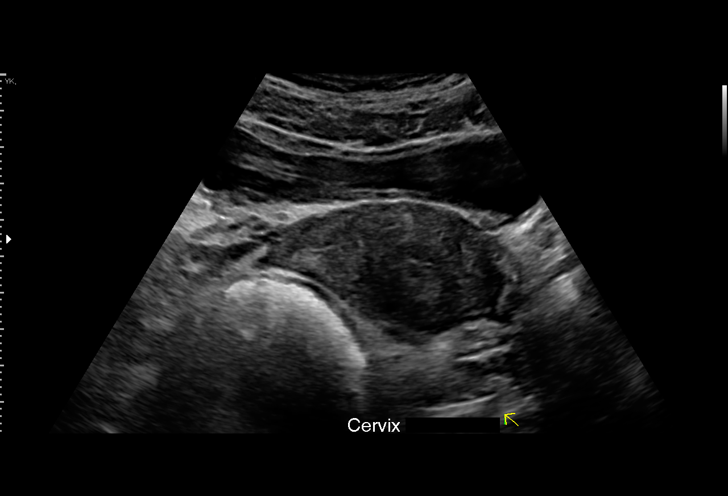
[im 19/51]
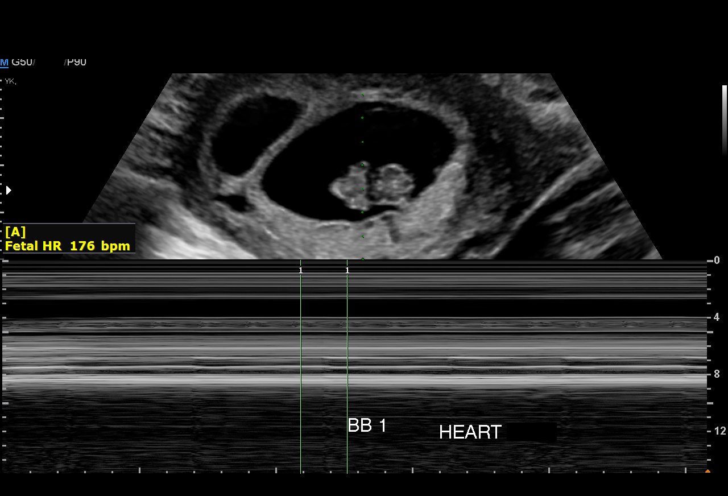
[im 23/51]
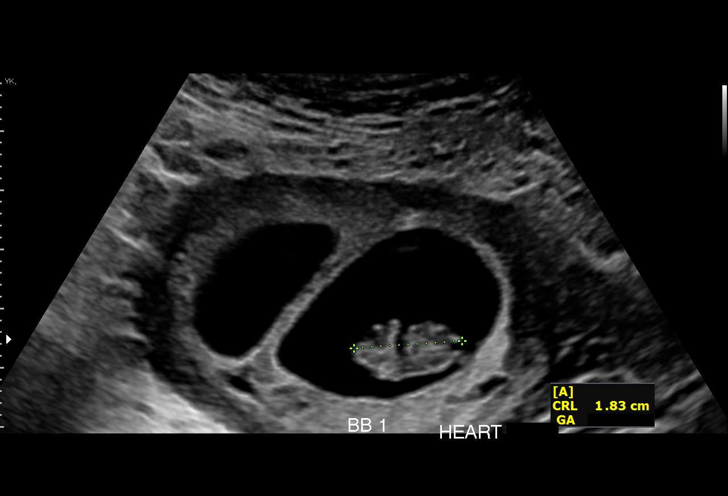
[im 26/51]
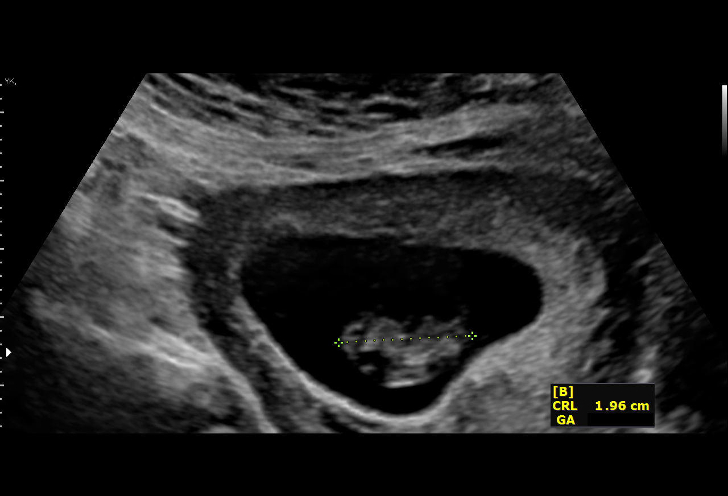
[im 28/51]
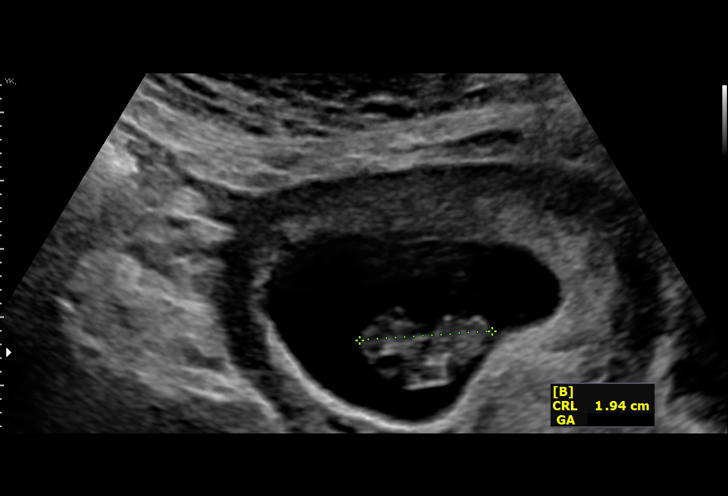
[im 32/51]
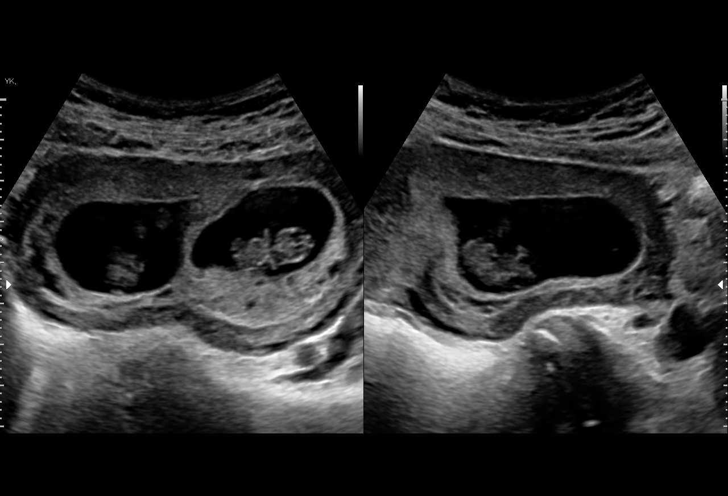
[im 36/51]
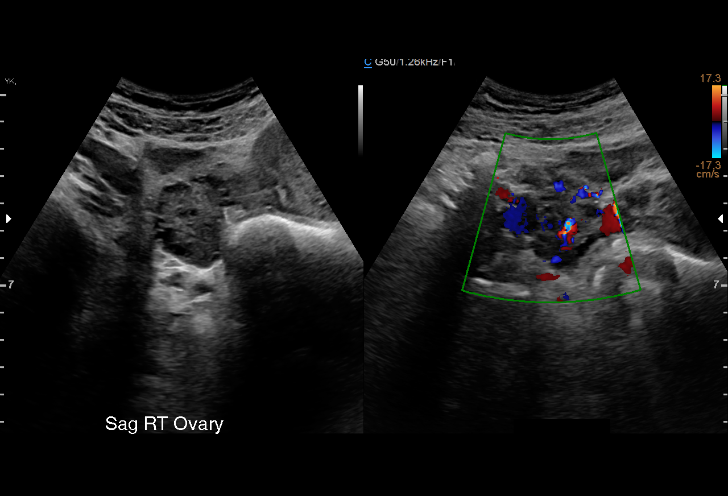
[im 39/51]
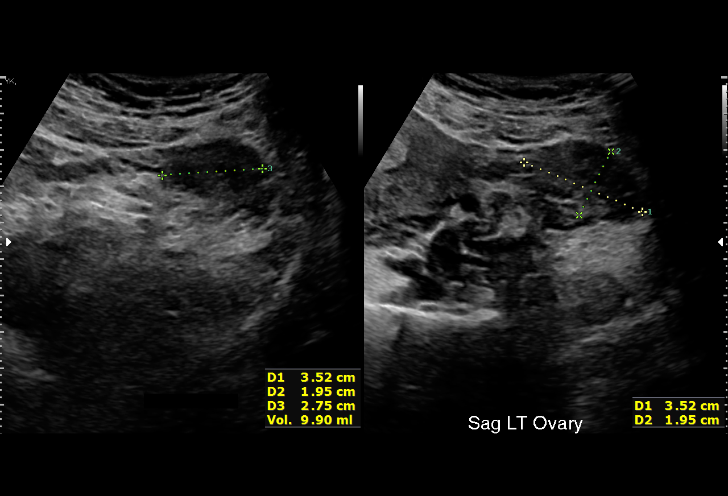
[im 43/51]
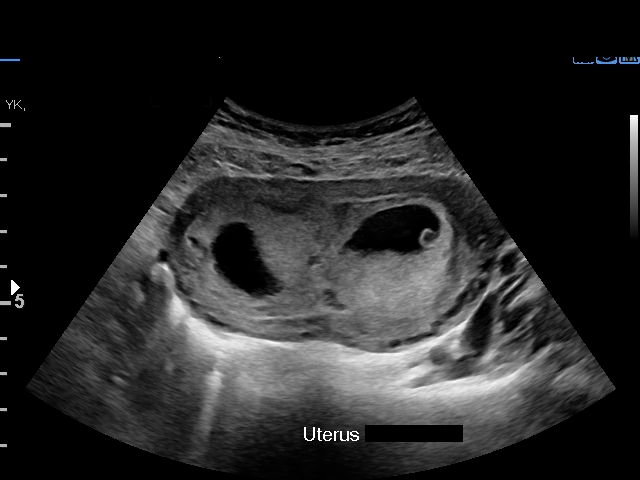
[im 47/51]
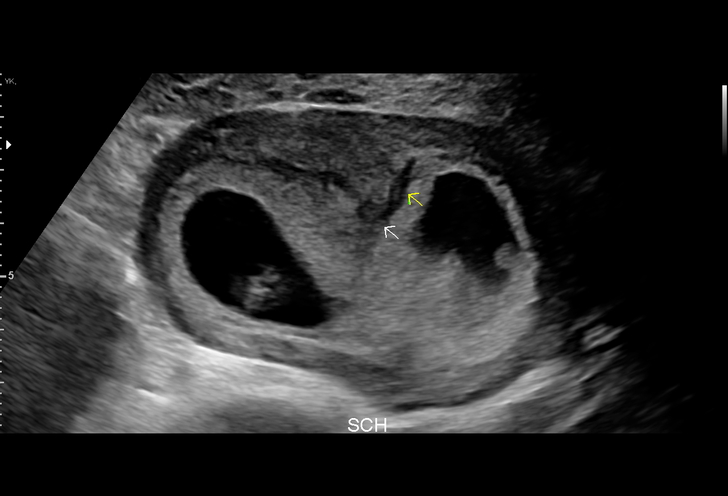
[im 51/51]
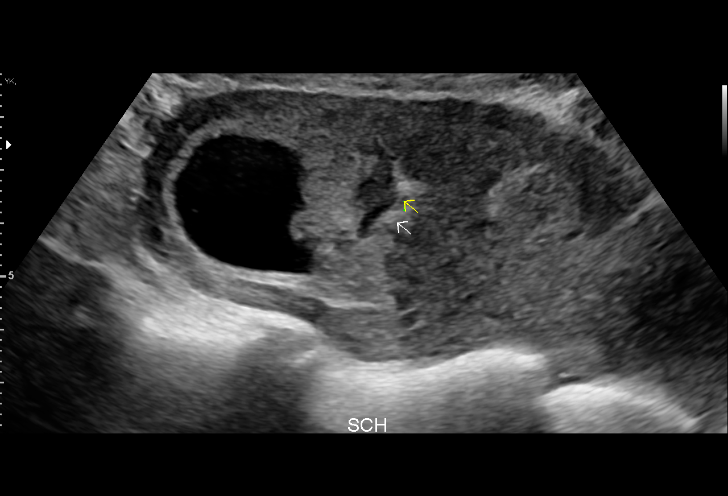

[15 of 28 positions shown; findings below may reference images not displayed]

FINDINGS: Number of IUPs:  2

Chorionicity/Amnionicity:  Dichorionic / diamniotic

TWIN 1

Yolk sac:  Present

Embryo:  Present

Cardiac Activity: Present

Heart Rate: 170 bpm

CRL:  19 mm   8 w 2 d                  US EDC: 02/21/2021

TWIN 2

Yolk sac:  Present

Embryo:  Present

Cardiac Activity: Present

Heart Rate: 170 bpm

CRL:  19.3 mm   8 w 3 d                  US EDC: 11/ [DATE]

Subchorionic hemorrhage:  Small

Maternal uterus/adnexae: Normal ovaries.  No free fluid
IMPRESSION: 1. Twin intrauterine gestations with embryos and normal cardiac
activity.

2. Estimated gestational age by crown rump length equals 8 weeks 3
days.

## 2021-11-08 IMAGING — US US MFM OB FOLLOW-UP
1 series · 12 of 28 positions shown · non-contrast
Comparison: none

[Series 1: us mfm ob follow-up · 12 of 66 slices shown]
[im 3/66]
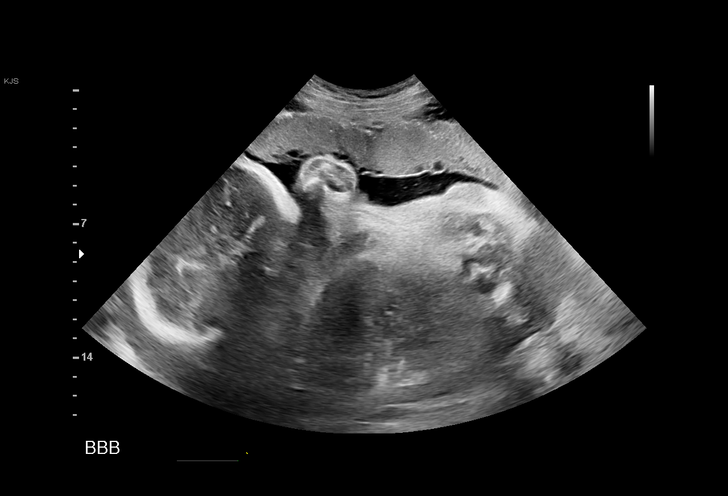
[im 8/66]
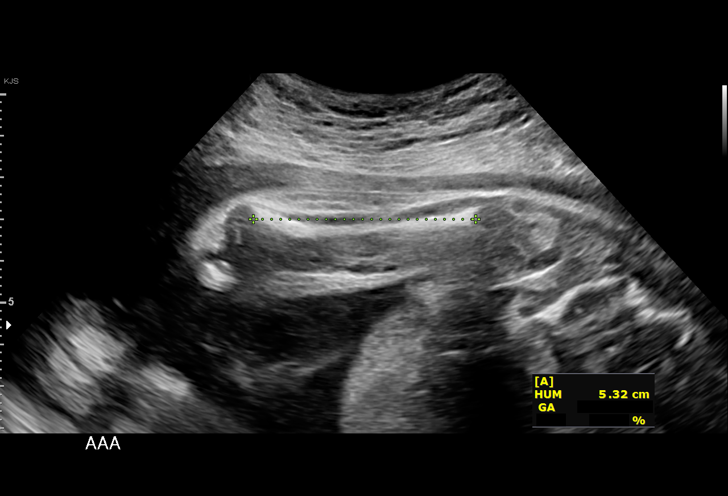
[im 13/66]
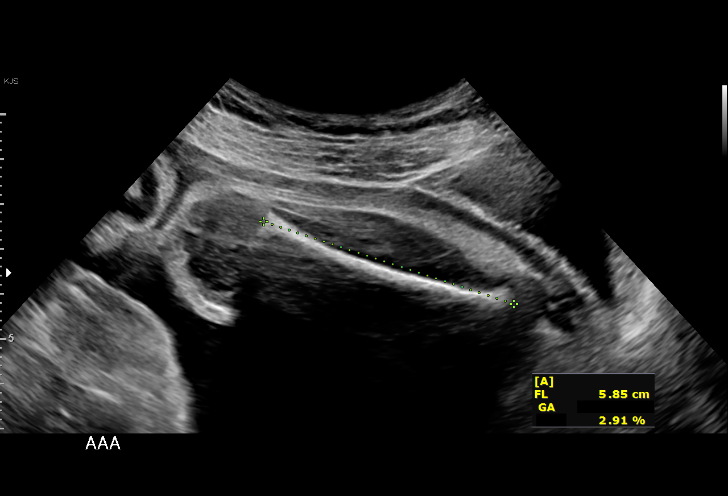
[im 20/66]
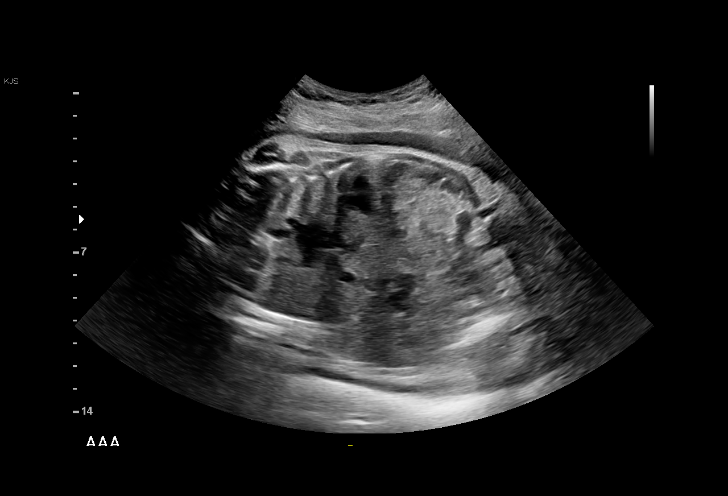
[im 25/66]
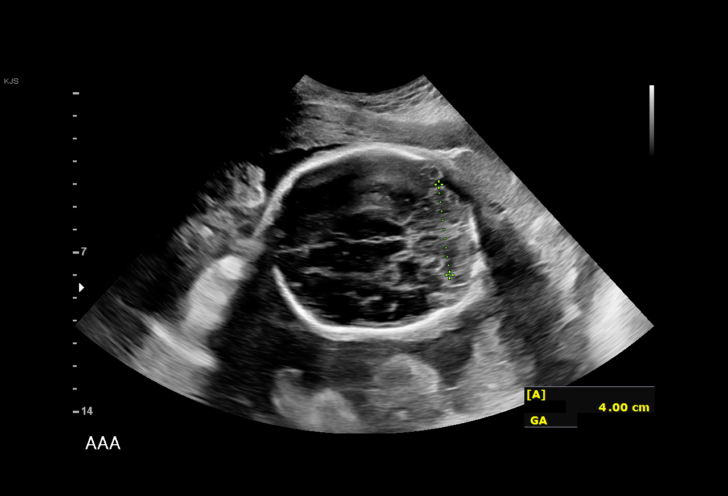
[im 29/66]
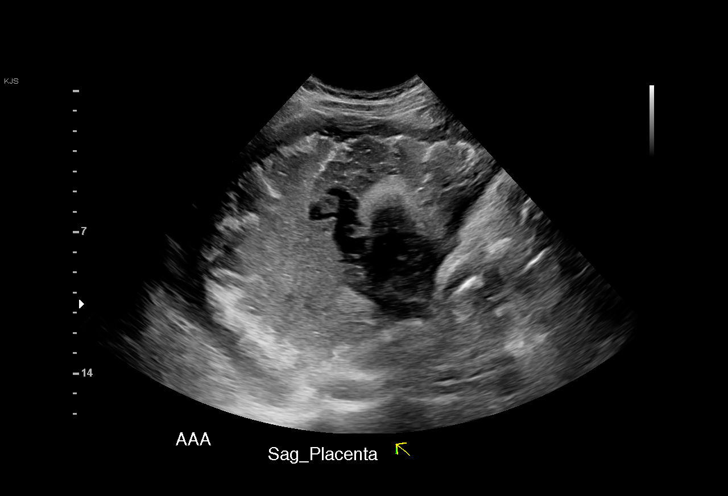
[im 37/66]
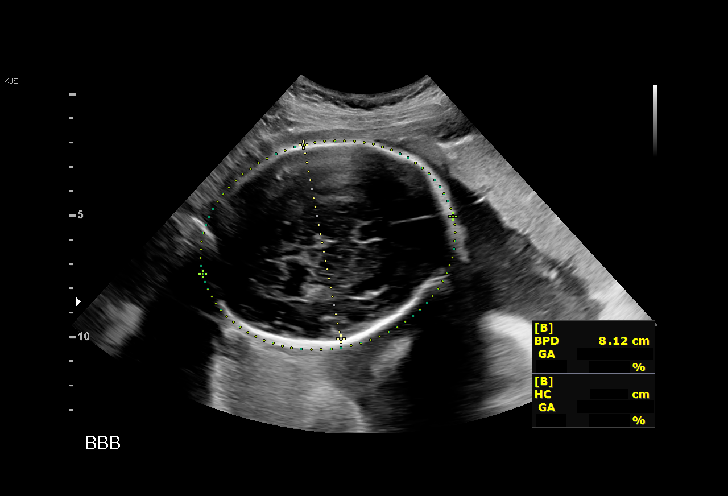
[im 41/66]
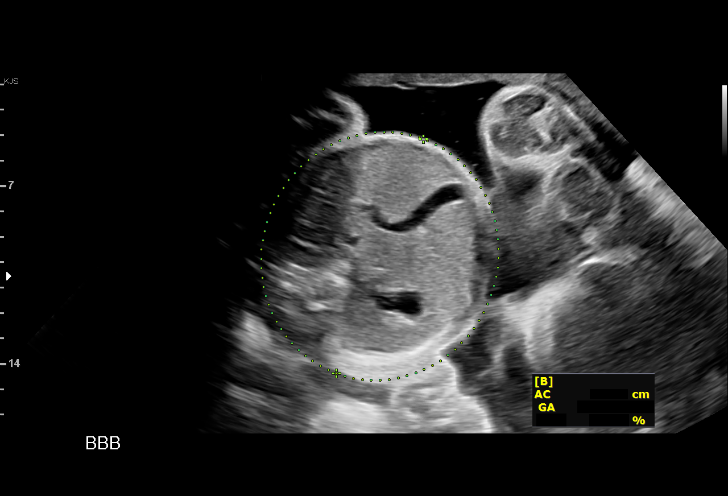
[im 46/66]
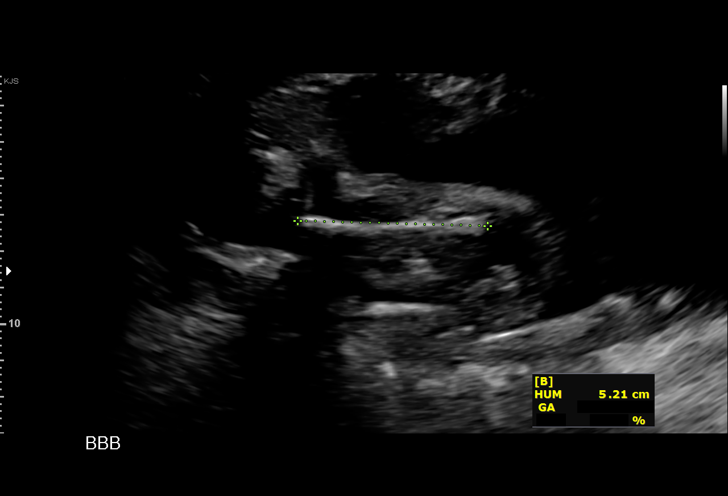
[im 53/66]
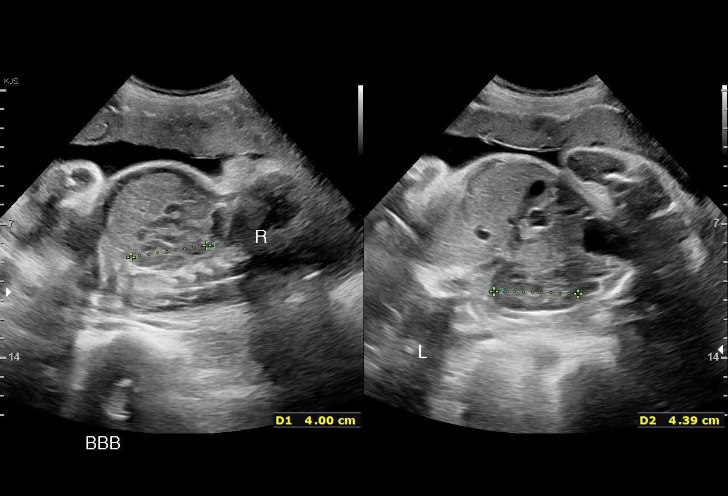
[im 58/66]
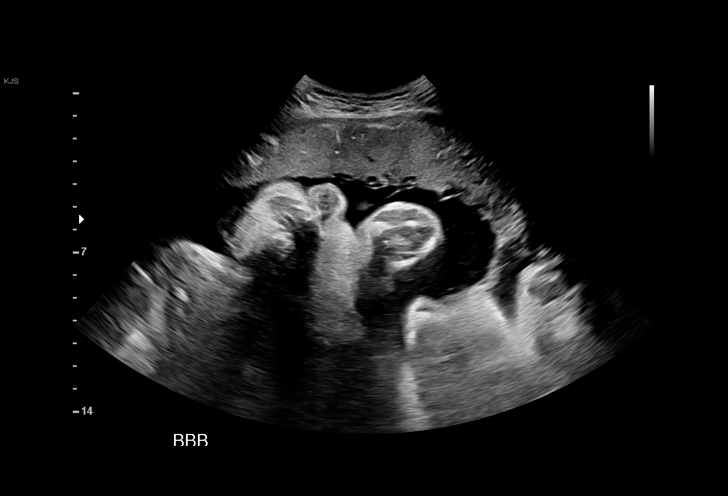
[im 63/66]
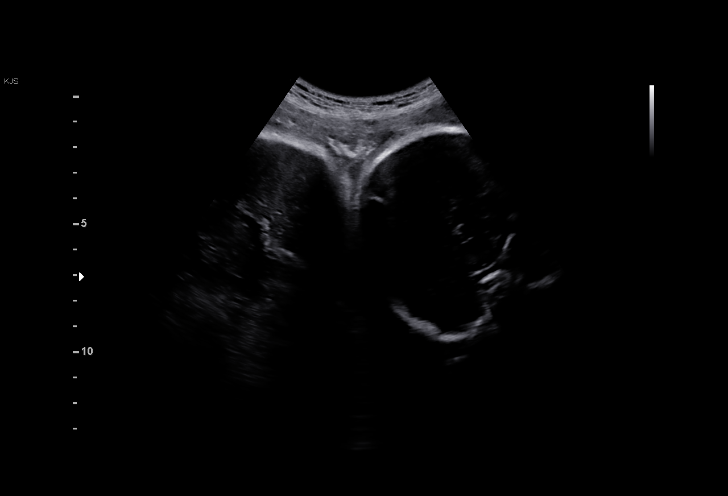

[12 of 28 positions shown; findings below may reference images not displayed]

OBGYN
                   SGJEPAN

                                                      MFOLO
    MASHIKO                                              MFOLO

Indications

 Twin pregnancy, di/di, third trimester
 32 weeks gestation of pregnancy
 Low risk NIPS, neg Horizon 4
 Smoking complicating pregnancy, third
 trimester
Fetal Evaluation (Fetus A)

 Num Of Fetuses:         2
 Fetal Heart Rate(bpm):  126
 Cardiac Activity:       Observed
 Fetal Lie:              Lower Fetus, Maternal Left
 Presentation:           Breech
 Placenta:               Left lateral
 P. Cord Insertion:      Previously Visualized
 Membrane Desc:      Dividing Membrane seen - Dichorionic.

 Amniotic Fluid
 AFI FV:      Within normal limits

                             Largest Pocket(cm)

Biometry (Fetus A)

 BPD:      80.7  mm     G. Age:  32w 3d         34  %    CI:        69.92   %    70 - 86
                                                         FL/HC:      18.9   %    19.9 -
 HC:      307.9  mm     G. Age:  34w 2d         56  %    HC/AC:      1.00        0.96 -
 AC:      308.4  mm     G. Age:  34w 6d         95  %    FL/BPD:     72.2   %    71 - 87
 FL:       58.3  mm     G. Age:  30w 3d        2.4  %    FL/AC:      18.9   %    20 - 24
 HUM:      53.2  mm     G. Age:  31w 0d         22  %
 CER:        40  mm     G. Age:  32w 2d         17  %

 LV:        3.1  mm

 Est. FW:    7341  gm    4 lb 13 oz      60  %     FW Discordancy      0 \ 8 %
OB History

 Gravidity:    1         Term:   0        Prem:   0        SAB:   0
 TOP:          0       Ectopic:  0        Living: 0
Gestational Age (Fetus A)

 LMP:           32w 5d        Date:  05/16/20                 EDD:   02/20/21
 U/S Today:     33w 0d                                        EDD:   02/18/21
 Best:          32w 5d     Det. By:  LMP  (05/16/20)          EDD:   02/20/21
Anatomy (Fetus A)

 Cranium:               Appears normal         Stomach:                Appears normal, left
                                                                       sided
 Cavum:                 Appears normal         Kidneys:                Appear normal
 Cerebellum:            Appears normal         Bladder:                Appears normal
 Diaphragm:             Appears normal

 Other:  Fetus A previously seen to be male.

Fetal Evaluation (Fetus B)

 Num Of Fetuses:         2
 Fetal Heart Rate(bpm):  131
 Cardiac Activity:       Observed
 Fetal Lie:              Upper Fetus, Maternal Right
 Presentation:           Breech
 Placenta:               Right lateral
 P. Cord Insertion:      Previously Visualized
 Membrane Desc:      Dividing Membrane seen - Dichorionic.

 Amniotic Fluid
 AFI FV:      Within normal limits

                             Largest Pocket(cm)

Biometry (Fetus B)

 BPD:      81.1  mm     G. Age:  32w 4d         39  %    CI:        73.02   %    70 - 86
                                                         FL/HC:      18.6   %    19.9 -
 HC:      301.7  mm     G. Age:  33w 3d         32  %    HC/AC:      1.00        0.96 -
 AC:      300.4  mm     G. Age:  34w 0d         84  %    FL/BPD:     69.1   %    71 - 87
 FL:         56  mm     G. Age:  29w 3d        < 1  %    FL/AC:      18.6   %    20 - 24
 HUM:      52.6  mm     G. Age:  30w 5d         15  %

 LV:        3.8  mm
 Est. FW:    9551  gm      4 lb 6 oz     34  %     FW Discordancy         8  %
Gestational Age (Fetus B)

 LMP:           32w 5d        Date:  05/16/20                 EDD:   02/20/21
 U/S Today:     32w 3d                                        EDD:   02/22/21
 Best:          32w 5d     Det. By:  LMP  (05/16/20)          EDD:   02/20/21
Anatomy (Fetus B)

 Cranium:               Appears normal         Stomach:                Appears normal, left
                                                                       sided
 Cavum:                 Appears normal         Kidneys:                Appear normal
 Diaphragm:             Appears normal         Bladder:                Appears normal

 Other:  Fetus B previously seen to be female.
Cervix Uterus Adnexa

 Cervix
 Not visualized (advanced GA >81wks)
Comments

 This patient was seen for a follow up growth scan due to a
 dichorionic, diamniotic twin gestation.  The patient reports
 that she failed her 1 hour GCT by 4 points (139).  She has
 not had a 3-hour confirmatory test performed yet.  She is
 scheduled for diabetic teaching at the end of this month.  The
 patient claims that she probably failed her 1 hour GCT
 because she drank sugary soda right before her 1 hour
 glucose challenge test was performed.  She denies any other
 problems since her last exam and reports feeling fetal
 movements throughout the day.
 She was informed that the fetal growth and amniotic fluid
 level appears appropriate for her gestational age for both twin
 A and twin B.
 As it is uncertain if she has gestational diabetes, the patient
 would like to have a 1 hour glucose challenge test repeated
 later this afternoon.  She understands that should she fail the
 1 hour glucose challenge test again today, that a confirmatory
 test, usually the 3-hour GTT or diabetic teaching with
 monitoring of daily fingersticks will be necessary.  She is
 willing to accept this testing strategy.
 Due to the dichorionic twin gestation and questionable
 gestational diabetes, we will start seeing her for weekly BPP's
 in two weeks at 34 weeks.
 A BPP was scheduled in our office in 2 weeks.

## 2023-03-17 ENCOUNTER — Emergency Department (HOSPITAL_BASED_OUTPATIENT_CLINIC_OR_DEPARTMENT_OTHER)
Admission: EM | Admit: 2023-03-17 | Discharge: 2023-03-17 | Disposition: A | Discharge status: Home / Self Care | Payer: Medicaid Other | Attending: Emergency Medicine | Admitting: Emergency Medicine

## 2023-03-17 ENCOUNTER — Other Ambulatory Visit: Payer: Self-pay

## 2023-03-17 ENCOUNTER — Encounter (HOSPITAL_BASED_OUTPATIENT_CLINIC_OR_DEPARTMENT_OTHER): Payer: Self-pay

## 2023-03-17 DIAGNOSIS — N938 Other specified abnormal uterine and vaginal bleeding: Secondary | ICD-10-CM

## 2023-03-17 DIAGNOSIS — N939 Abnormal uterine and vaginal bleeding, unspecified: Secondary | ICD-10-CM | POA: Insufficient documentation

## 2023-03-17 DIAGNOSIS — Z7982 Long term (current) use of aspirin: Secondary | ICD-10-CM | POA: Diagnosis not present

## 2023-03-17 LAB — BASIC METABOLIC PANEL
Anion gap: 9 (ref 5–15)
BUN: 8 mg/dL (ref 6–20)
CO2: 24 mmol/L (ref 22–32)
Calcium: 9.2 mg/dL (ref 8.9–10.3)
Chloride: 104 mmol/L (ref 98–111)
Creatinine, Ser: 0.56 mg/dL (ref 0.44–1.00)
GFR, Estimated: >60 mL/min (ref >60)
Glucose, Bld: 100 mg/dL — ABNORMAL HIGH (ref 70–99)
Potassium: 3.5 mmol/L (ref 3.5–5.1)
Sodium: 137 mmol/L (ref 135–145)

## 2023-03-17 LAB — CBC WITH DIFFERENTIAL/PLATELET
Abs Immature Granulocytes: 0.02 10*3/uL (ref 0.00–0.07)
Basophils Absolute: 0 10*3/uL (ref 0.0–0.1)
Basophils Relative: 1 %
Eosinophils Absolute: 0.2 10*3/uL (ref 0.0–0.5)
Eosinophils Relative: 3 %
HCT: 43.3 % (ref 36.0–46.0)
Hemoglobin: 14.4 g/dL (ref 12.0–15.0)
Immature Granulocytes: 0 %
Lymphocytes Relative: 17 %
Lymphs Abs: 0.8 10*3/uL (ref 0.7–4.0)
MCH: 28.3 pg (ref 26.0–34.0)
MCHC: 33.3 g/dL (ref 30.0–36.0)
MCV: 85.2 fL (ref 80.0–100.0)
Monocytes Absolute: 0.6 10*3/uL (ref 0.1–1.0)
Monocytes Relative: 12 %
Neutro Abs: 3.1 10*3/uL (ref 1.7–7.7)
Neutrophils Relative %: 67 %
Platelets: 180 10*3/uL (ref 150–400)
RBC: 5.08 MIL/uL (ref 3.87–5.11)
RDW: 13.1 % (ref 11.5–15.5)
WBC: 4.7 10*3/uL (ref 4.0–10.5)
nRBC: 0 % (ref 0.0–0.2)

## 2023-03-17 LAB — URINALYSIS, W/ REFLEX TO CULTURE (INFECTION SUSPECTED)
Bilirubin Urine: NEGATIVE
Glucose, UA: NEGATIVE mg/dL
Ketones, ur: NEGATIVE mg/dL
Leukocytes,Ua: NEGATIVE
Nitrite: NEGATIVE
Protein, ur: NEGATIVE mg/dL
Specific Gravity, Urine: 1.025 (ref 1.005–1.030)
pH: 7.5 (ref 5.0–8.0)

## 2023-03-17 LAB — HCG, SERUM, QUALITATIVE: Preg, Serum: NEGATIVE

## 2023-03-17 NOTE — ED Triage Notes (Signed)
Patient here POV from Home.  Endorses 1.5 weeks of vaginal bleeding. Consistent but waxes and wanes in severity. Mild cramping at times. No N/V/D. Some fatigue. No Dysuria noted.   NAD Noted during Triage. A&Ox4. GCS 15. Ambulatory.

## 2023-03-17 NOTE — ED Provider Notes (Signed)
[place] [facility] Provider Note   CSN: [id number] Arrival date & time: 03/17/23  1117     History  Chief Complaint  Patient presents with   Vaginal Bleeding    April Hart is a 27 y.o. female.  She is here with a complaint of vaginal bleeding for a week.  She said she had a normal period about 3 weeks ago and that it stopped after about 5 days.  It restarted again about a week ago and has been persistent.  She usually has regular cycles once a month.  She said it is minor flow, using maybe 1 pad a day.  Minimal cramps.  No fevers.  Does endorse feeling fatigued.  Called her GYN but they did not have appointment for few weeks so came here for further evaluation.  No fevers no dysuria no vaginal discharge.  Not on any birth control pills and is sexually active.  The history is provided by the patient.  Vaginal Bleeding Quality:  Dark red Severity:  Mild Onset quality:  Gradual Duration:  1 week Timing:  Intermittent Progression:  Unchanged Chronicity:  New Menstrual history:  Regular Context: spontaneously   Relieved by:  None tried Worsened by:  Nothing Ineffective treatments:  None tried Associated symptoms: dizziness and fatigue   Associated symptoms: no abdominal pain, no dysuria, no fever, no nausea and no vaginal discharge        Home Medications Prior to Admission medications   Medication Sig Start Date End Date Taking? Authorizing Provider  acetaminophen (TYLENOL) 325 MG tablet Take 2 tablets (650 mg total) by mouth every 6 (six) hours as needed. Do not take more than 4000mg  of tylenol per day Patient not taking: Reported on 10/08/2020 08/19/17   Couture, Cortni S, PA-C  albuterol (PROVENTIL HFA;VENTOLIN HFA) 108 (90 Base) MCG/ACT inhaler Inhale 2 puffs into the lungs every 6 (six) hours as needed for wheezing or shortness of breath. 01/23/18 02/22/18  Elvina Sidle, MD  aspirin EC 81 MG tablet Take 81 mg by mouth daily.  Swallow whole.    [provider]  chlorhexidine (PERIDEX) 0.12 % solution Use as directed 15 mLs in the mouth or throat 2 (two) times daily. Patient not taking: Reported on 10/08/2020 01/23/18   Elvina Sidle, MD  clindamycin (CLEOCIN) 150 MG capsule Take 1 capsule (150 mg total) by mouth 3 (three) times daily. Patient not taking: Reported on 10/08/2020 01/23/18   Elvina Sidle, MD  folic acid (FOLVITE) 400 MCG tablet Take 400 mcg by mouth daily.    [provider]  loratadine (CLARITIN) 10 MG tablet Take 1 tablet (10 mg total) by mouth daily as needed for allergies. 01/23/18   Elvina Sidle, MD  mometasone (NASONEX) 50 MCG/ACT nasal spray Place 2 sprays into the nose 3 (three) times a week. Patient not taking: Reported on 10/08/2020 01/24/18   Elvina Sidle, MD  montelukast (SINGULAIR) 10 MG tablet Take 1 tablet (10 mg total) by mouth daily. Patient not taking: Reported on 10/08/2020 01/23/18   Elvina Sidle, MD  Prenatal Vit-Fe Fumarate-FA (PRENATAL MULTIVITAMIN) TABS tablet Take 1 tablet by mouth daily at 12 noon.    [provider]      Allergies    Amoxicillin, Doxycycline, Omnicef [cefdinir], and Penicillins    Review of Systems   Review of Systems  Constitutional:  Positive for fatigue. Negative for fever.  Respiratory:  Negative for shortness of breath.   Cardiovascular:  Negative for  chest pain.  Gastrointestinal:  Negative for abdominal pain and nausea.  Genitourinary:  Positive for vaginal bleeding. Negative for dysuria and vaginal discharge.  Neurological:  Positive for dizziness.    Physical Exam Updated Vital Signs BP 121/81 (BP Location: Right Arm)   Pulse (!) 110   Temp (!) 97.5 F (36.4 C) (Oral)   Resp 18   Ht 5\' 2"  (1.575 m)   Wt 49 kg   SpO2 100%   BMI 19.75 kg/m  Physical Exam Vitals and nursing note reviewed.  Constitutional:      General: She is not in acute distress.    Appearance: Normal appearance. She is  well-developed.  HENT:     Head: Normocephalic and atraumatic.  Eyes:     Conjunctiva/sclera: Conjunctivae normal.  Cardiovascular:     Rate and Rhythm: Normal rate and regular rhythm.     Heart sounds: No murmur heard. Pulmonary:     Effort: Pulmonary effort is normal. No respiratory distress.     Breath sounds: Normal breath sounds.  Abdominal:     Palpations: Abdomen is soft.     Tenderness: There is no abdominal tenderness. There is no guarding or rebound.  Musculoskeletal:        General: No deformity.     Cervical back: Neck supple.  Skin:    General: Skin is warm and dry.     Capillary Refill: Capillary refill takes less than 2 seconds.  Neurological:     General: No focal deficit present.     Mental Status: She is alert.     ED Results / Procedures / Treatments   Labs (all labs ordered are listed, but only abnormal results are displayed) Labs Reviewed  BASIC METABOLIC PANEL - Abnormal; Notable for the following components:      Result Value   Glucose, Bld 100 (*)    All other components within normal limits  URINALYSIS, W/ REFLEX TO CULTURE (INFECTION SUSPECTED) - Abnormal; Notable for the following components:   APPearance CLOUDY (*)    Hgb urine dipstick LARGE (*)    Bacteria, UA MANY (*)    All other components within normal limits  CBC WITH DIFFERENTIAL/PLATELET  HCG, SERUM, QUALITATIVE    EKG None  Radiology No results found.  Procedures Procedures    Medications Ordered in ED Medications - No data to display  ED Course/ Medical Decision Making/ A&P Clinical Course as of 03/17/23 1641  Wed Mar 17, 2023  1241 Labs very mobile.  Reviewed with patient.  Recommended follow-up with her GYN and continue symptomatic treatment. [MB]    Clinical Course User Index [MB] Terrilee Files, MD                                 Medical Decision Making Amount and/or Complexity of Data Reviewed Labs: ordered.   This patient complains of vaginal  bleeding; this involves an extensive number of treatment Options and is a complaint that carries with it a high risk of complications and morbidity. The differential includes dysfunctional bleeding, pregnancy, ectopic, anemia  I ordered, reviewed and interpreted labs, which included CBC unremarkable chemistries unremarkable urinalysis with blood in her urine pregnancy negative Previous records obtained and reviewed in epic no recent admissions Cardiac monitoring reviewed, normal sinus rhythm Social determinants considered, tobacco use Critical Interventions: None  After the interventions stated above, I reevaluated the patient and found patient to be well-appearing  and hemodynamically stable Admission and further testing considered, no indications for admission or further workup at this time.  Recommended close follow-up with her gynecologist.  Return instructions discussed         Final Clinical Impression(s) / ED Diagnoses Final diagnoses:  Dysfunctional uterine bleeding    Rx / DC Orders ED Discharge Orders     None         Terrilee Files, MD 03/17/23 1642

## 2023-03-17 NOTE — Discharge Instructions (Signed)
Your lab work showed your hemoglobin to be stable.  Please keep well-hydrated and follow-up with your gynecologist for further management.  Return if any worsening or concerning symptoms

## 2023-03-17 NOTE — ED Notes (Signed)
ED Provider at bedside.
